# Patient Record
Sex: Female | Born: 1990 | Race: Black or African American | Hispanic: No | Marital: Single | Smoking: Former smoker
Health system: Southern US, Community
[De-identification: ages and names within clinical notes are randomized; demographics above are authoritative.]

## PROBLEM LIST (undated history)

## (undated) DIAGNOSIS — A549 Gonococcal infection, unspecified: Secondary | ICD-10-CM

## (undated) DIAGNOSIS — A749 Chlamydial infection, unspecified: Secondary | ICD-10-CM

## (undated) DIAGNOSIS — B977 Papillomavirus as the cause of diseases classified elsewhere: Secondary | ICD-10-CM

## (undated) HISTORY — PX: CERVICAL BIOPSY: SHX590

## (undated) HISTORY — PX: COLPOSCOPY: SHX161

---

## 2017-03-04 ENCOUNTER — Encounter (HOSPITAL_BASED_OUTPATIENT_CLINIC_OR_DEPARTMENT_OTHER): Payer: Self-pay | Admitting: Emergency Medicine

## 2017-03-04 DIAGNOSIS — J988 Other specified respiratory disorders: Secondary | ICD-10-CM | POA: Insufficient documentation

## 2017-03-04 DIAGNOSIS — Z87891 Personal history of nicotine dependence: Secondary | ICD-10-CM | POA: Insufficient documentation

## 2017-03-04 NOTE — ED Triage Notes (Signed)
Pt reports fevers from 99-105 at home for a couple of days. Also reports sinus congestion. Using ibuprofen and tylenol at home.

## 2017-03-05 ENCOUNTER — Emergency Department (HOSPITAL_BASED_OUTPATIENT_CLINIC_OR_DEPARTMENT_OTHER)
Admission: EM | Admit: 2017-03-05 | Discharge: 2017-03-05 | Disposition: A | Discharge status: Home / Self Care | Payer: Self-pay | Attending: Emergency Medicine | Admitting: Emergency Medicine

## 2017-03-05 ENCOUNTER — Emergency Department (HOSPITAL_BASED_OUTPATIENT_CLINIC_OR_DEPARTMENT_OTHER): Payer: Self-pay

## 2017-03-05 ENCOUNTER — Encounter (HOSPITAL_BASED_OUTPATIENT_CLINIC_OR_DEPARTMENT_OTHER): Payer: Self-pay

## 2017-03-05 DIAGNOSIS — B9789 Other viral agents as the cause of diseases classified elsewhere: Secondary | ICD-10-CM

## 2017-03-05 DIAGNOSIS — J988 Other specified respiratory disorders: Secondary | ICD-10-CM

## 2017-03-05 HISTORY — DX: Chlamydial infection, unspecified: A74.9

## 2017-03-05 HISTORY — DX: Papillomavirus as the cause of diseases classified elsewhere: B97.7

## 2017-03-05 HISTORY — DX: Gonococcal infection, unspecified: A54.9

## 2017-03-05 LAB — RAPID STREP SCREEN (MED CTR MEBANE ONLY): Streptococcus, Group A Screen (Direct): NEGATIVE

## 2017-03-05 MED ORDER — ACETAMINOPHEN 325 MG PO TABS
650.0000 mg | ORAL_TABLET | Freq: Once | ORAL | Status: AC
  Administered 2017-03-05: 650 mg via ORAL
  Filled 2017-03-05: qty 2

## 2017-03-05 MED ORDER — OXYMETAZOLINE HCL 0.05 % NA SOLN
2.0000 | Freq: Two times a day (BID) | NASAL | Status: DC | PRN
  Administered 2017-03-05: 2 via NASAL
  Filled 2017-03-05: qty 15

## 2017-03-05 NOTE — ED Provider Notes (Signed)
[facility] Provider Note: Cassidy Dell, MD, FACEP  CSN: [id number] MRN: [id number] ARRIVAL: 03/04/17 at 2333 ROOM: [facility]   CHIEF COMPLAINT  Fever   HISTORY OF PRESENT ILLNESS  Cassidy Chen is a 26 y.o. female with a 2 to three-day history of fever as high as 103. She has been treating it with ibuprofen and acetaminophen at home. Associated symptoms include nasal congestion, sinus drainage, sore throat, cough, body aches and malaise. She denies shortness of breath, nausea, vomiting or diarrhea.    Past Medical History:  Diagnosis Date  . Chlamydia   . Gonorrhea   . HPV in female     Past Surgical History:  Procedure Laterality Date  . CERVICAL BIOPSY    . COLPOSCOPY      No family history on file.  Social History  Substance Use Topics  . Smoking status: Former Games developer  . Smokeless tobacco: Never Used  . Alcohol use No    Prior to Admission medications   Not on File    Allergies Patient has no known allergies.   REVIEW OF SYSTEMS  Negative except as noted here or in the History of Present Illness.   PHYSICAL EXAMINATION  Initial Vital Signs Blood pressure (!) 135/97, pulse 100, temperature 98.2 F (36.8 C), temperature source Oral, resp. rate 18, height 5\' 5"  (1.651 m), weight (!) 308 lb 10.3 oz (140 kg), SpO2 100 %.  Examination General: Well-developed, obese female in no acute distress; appearance consistent with age of record HENT: normocephalic; atraumatic; nasal congestion; pharyngeal erythema without exudate Eyes: pupils equal, round and reactive to light; extraocular muscles intact Neck: supple Heart: regular rate and rhythm Lungs: clear to auscultation bilaterally Abdomen: soft; nondistended; nontender; bowel sounds present Extremities: No deformity; full range of motion; pulses normal Neurologic: Awake, alert and oriented; motor function intact in all extremities and symmetric; no facial droop Skin: Warm and dry Psychiatric:  Normal mood and affect   RESULTS  Summary of this visit's results, reviewed by myself:   EKG Interpretation  Date/Time:    Ventricular Rate:    PR Interval:    QRS Duration:   QT Interval:    QTC Calculation:   R Axis:     Text Interpretation:        Laboratory Studies: Results for orders placed or performed during the hospital encounter of 03/05/17 (from the past 24 hour(s))  Rapid strep screen     Status: None   Collection Time: 03/04/17 11:50 PM  Result Value Ref Range   Streptococcus, Group A Screen (Direct) NEGATIVE NEGATIVE   Imaging Studies: Dg Chest 2 View  Result Date: 03/05/2017 CLINICAL DATA:  Cough and fever for 2 days EXAM: CHEST  2 VIEW COMPARISON:  None. FINDINGS: The lungs are clear. The pulmonary vasculature is normal. Heart size is normal. Hilar and mediastinal contours are unremarkable. There is no pleural effusion. IMPRESSION: No active cardiopulmonary disease. Electronically Signed   By: Ellery Plunk M.D.   On: 03/05/2017 03:35    ED COURSE  Nursing notes and initial vitals signs, including pulse oximetry, reviewed.  Vitals:   03/04/17 2349 03/05/17 0030 03/05/17 0115  BP: 129/76  (!) 135/97  Pulse: (!) 117  100  Resp: 20  18  Temp: 98.7 F (37.1 C)  98.2 F (36.8 C)  TempSrc: Oral  Oral  SpO2: 98%  100%  Weight:  (!) 308 lb 10.3 oz (140 kg)   Height:  5\' 5"  (1.651 m)  4:05 AM Symptoms consistent with a viral illness.  PROCEDURES    ED DIAGNOSES     ICD-9-CM ICD-10-CM   1. Viral respiratory illness 079.99 J98.8     B97.89        Paula LibraJohn Fard Borunda, MD 03/05/17 [phone]

## 2017-03-05 NOTE — ED Notes (Signed)
Pt ambulatory to restroom.  Pt also wondering where the vending machines are.

## 2017-03-05 NOTE — ED Notes (Signed)
Pt verbalizes understanding of d/c instructions and denies any further needs at this time. 

## 2017-03-07 LAB — CULTURE, GROUP A STREP (THRC)

## 2018-09-05 IMAGING — DX DG CHEST 2V
2 series · 2 of 2 positions shown · non-contrast
Comparison: None.

CLINICAL DATA: Cough and fever for 2 days

EXAM:
CHEST  2 VIEW

[chest pa]
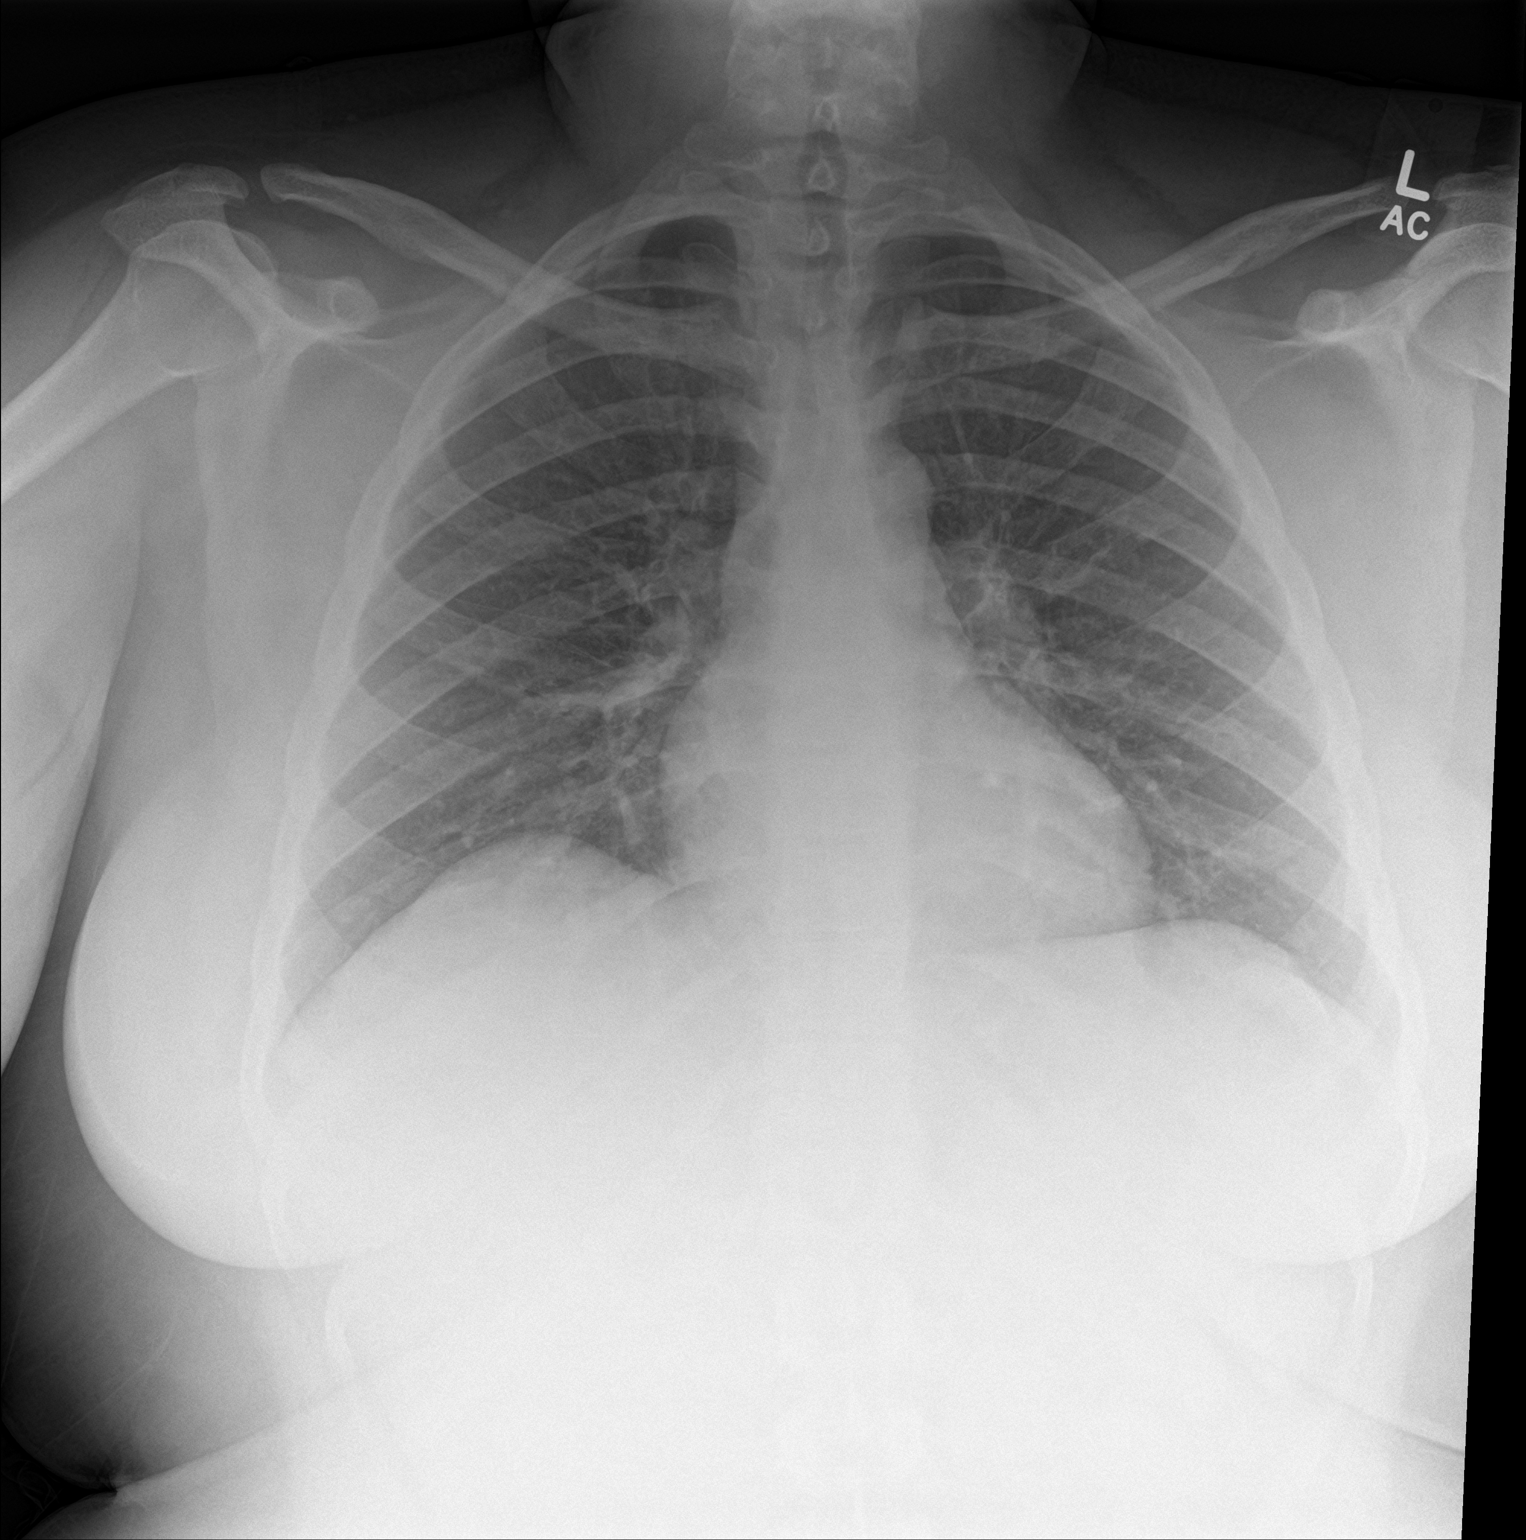

[chest lat]
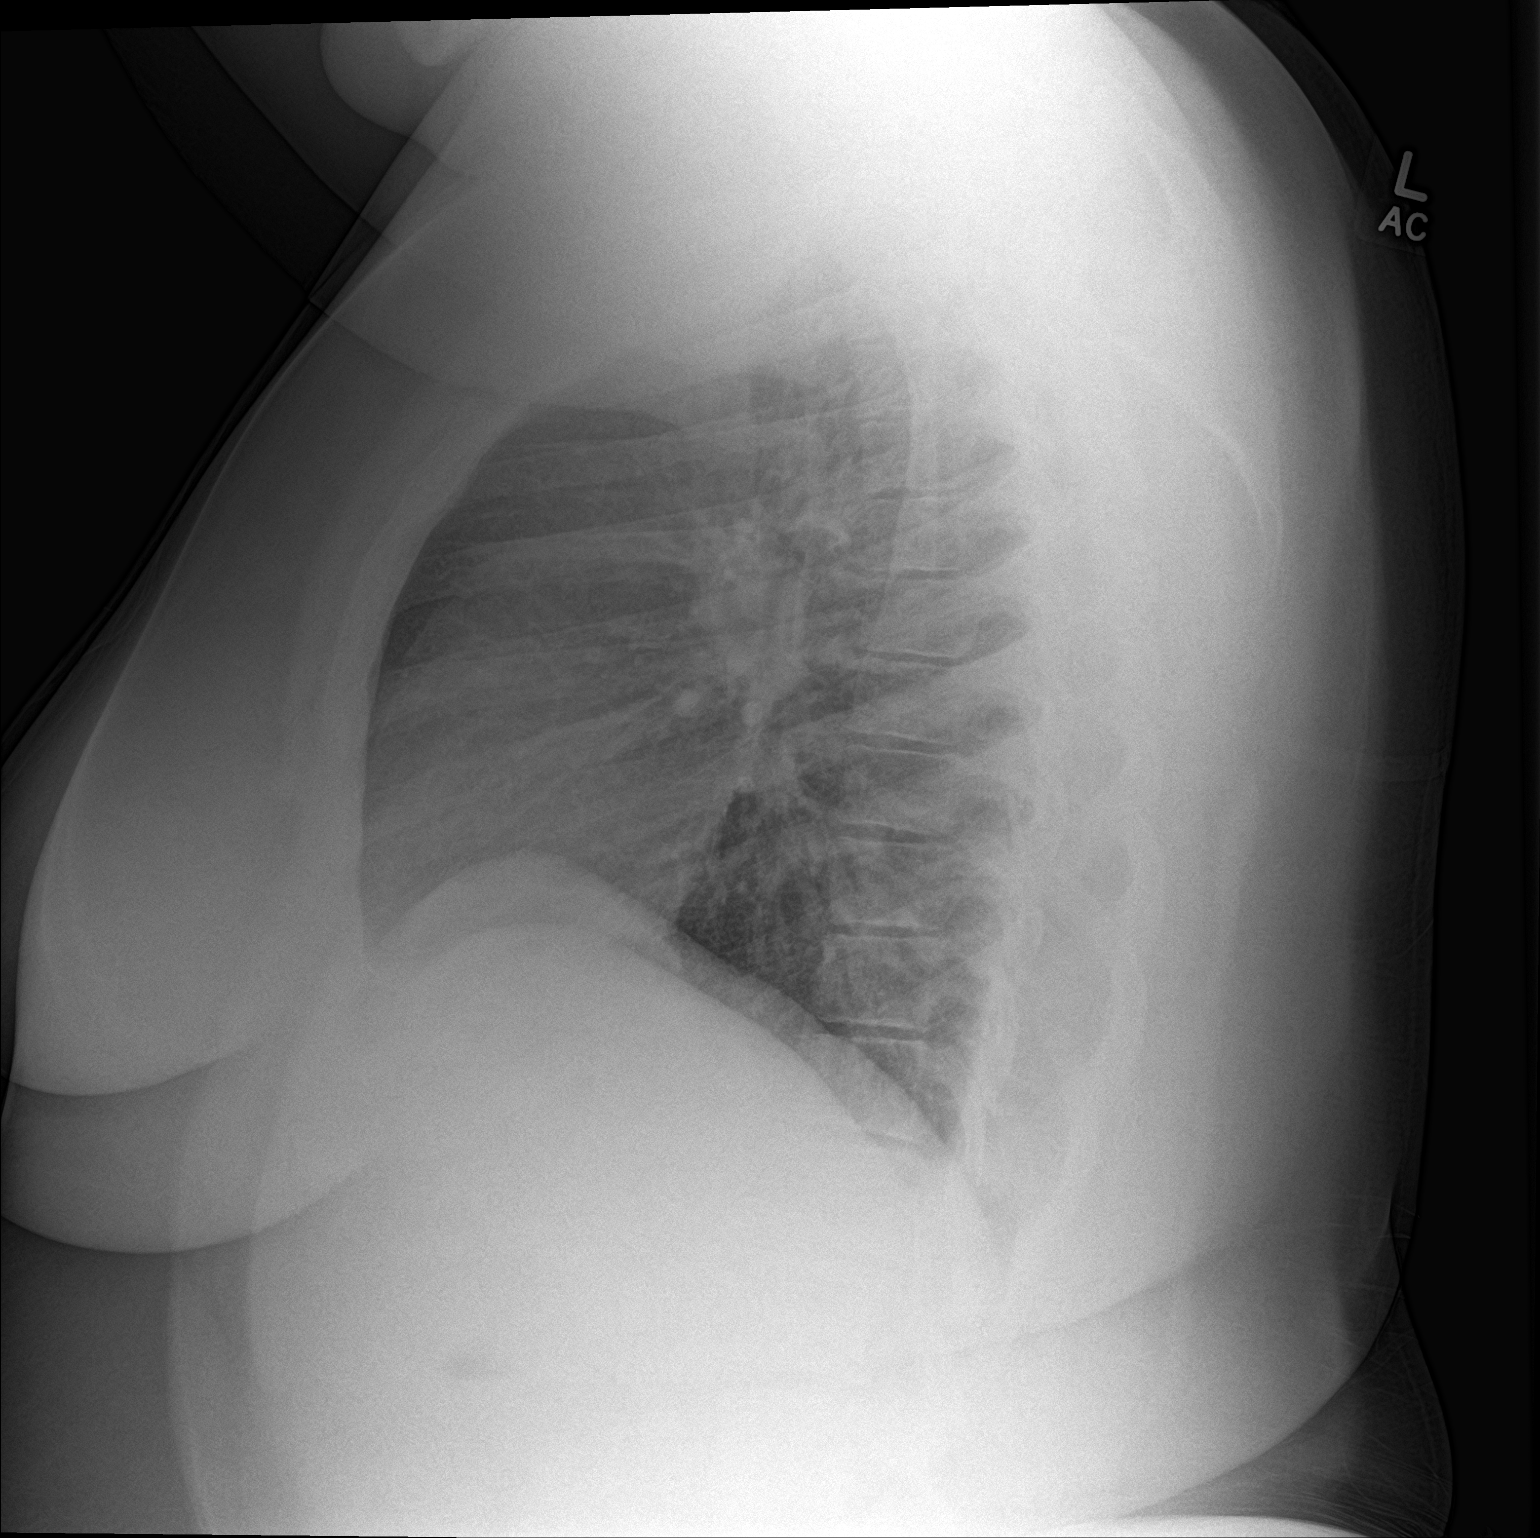

[2 of 2 positions shown; findings below may reference images not displayed]

FINDINGS: The lungs are clear. The pulmonary vasculature is normal. Heart size
is normal. Hilar and mediastinal contours are unremarkable. There is
no pleural effusion.
IMPRESSION: No active cardiopulmonary disease.
